# Patient Record
Sex: Female | Born: 1995 | Race: White | Hispanic: No | Marital: Married | Smoking: Current some day smoker
Health system: Southern US, Community
[De-identification: ages and names within clinical notes are randomized; demographics above are authoritative.]

## PROBLEM LIST (undated history)

## (undated) HISTORY — PX: TONSILLECTOMY: SUR1361

## (undated) HISTORY — PX: FRACTURE SURGERY: SHX138

---

## 2020-06-01 ENCOUNTER — Encounter: Payer: Self-pay | Admitting: Emergency Medicine

## 2020-06-01 ENCOUNTER — Emergency Department (INDEPENDENT_AMBULATORY_CARE_PROVIDER_SITE_OTHER): Payer: PRIVATE HEALTH INSURANCE

## 2020-06-01 ENCOUNTER — Emergency Department (INDEPENDENT_AMBULATORY_CARE_PROVIDER_SITE_OTHER)
Admission: EM | Admit: 2020-06-01 | Discharge: 2020-06-01 | Disposition: A | Discharge status: Home / Self Care | Payer: PRIVATE HEALTH INSURANCE | Source: Home / Self Care

## 2020-06-01 ENCOUNTER — Other Ambulatory Visit: Payer: Self-pay

## 2020-06-01 DIAGNOSIS — S59912A Unspecified injury of left forearm, initial encounter: Secondary | ICD-10-CM

## 2020-06-01 DIAGNOSIS — M79602 Pain in left arm: Secondary | ICD-10-CM | POA: Diagnosis not present

## 2020-06-01 MED ORDER — NAPROXEN 500 MG PO TABS: 500.0000 mg | ORAL_TABLET | Freq: Two times a day (BID) | ORAL | 0 refills | Status: DC | PRN
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
## 2020-06-01 NOTE — Discharge Instructions (Addendum)
Recommend ace wrapping, along with icing, and taking antiinflammatory twice daily over the next 3-5 days. If no improvement in pain, recommend orthopedic follow-up.

## 2020-06-07 ENCOUNTER — Ambulatory Visit: Admitting: Adult Health

## 2022-03-16 IMAGING — DX DG FOREARM 2V*L*
2 series · 2 of 2 positions shown · non-contrast
Comparison: No prior.

CLINICAL DATA: Left forearm injury.

EXAM:
LEFT FOREARM - 2 VIEW

[forearm ap]
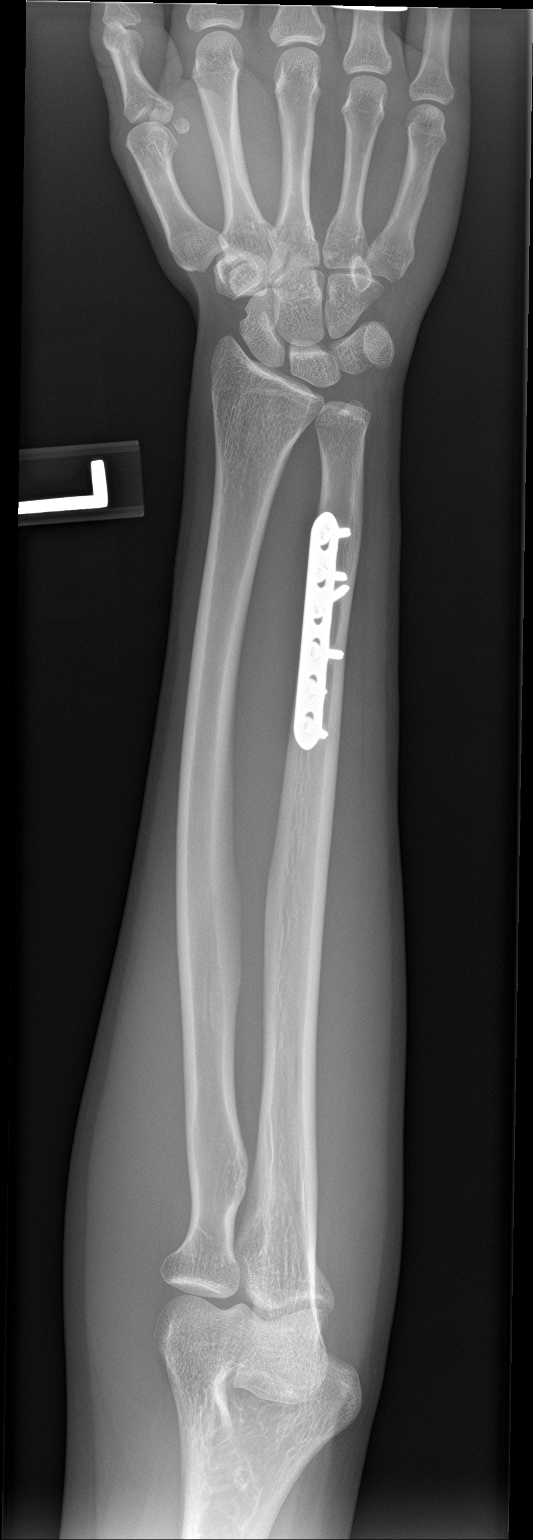

[forearm lat]
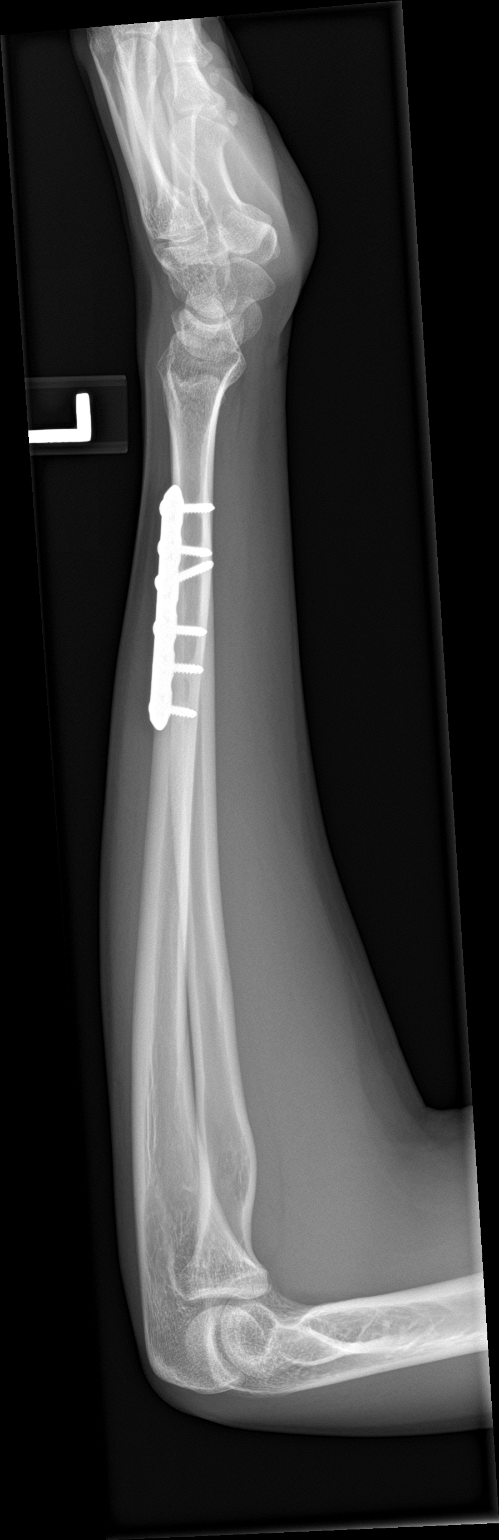

[2 of 2 positions shown; findings below may reference images not displayed]

FINDINGS: Plate screw fixation of the ulna noted. Hardware intact. Anatomic
alignment. Radius is intact. No acute bony abnormality. No evidence
of acute fracture or dislocation.
IMPRESSION: Plate and screw fixation of the ulna. Hardware intact. Anatomic
alignment. No acute bony abnormality.

## 2023-12-15 ENCOUNTER — Ambulatory Visit
Admission: EM | Admit: 2023-12-15 | Discharge: 2023-12-15 | Disposition: A | Discharge status: Home / Self Care | Attending: Physician Assistant | Admitting: Physician Assistant

## 2023-12-15 DIAGNOSIS — H6992 Unspecified Eustachian tube disorder, left ear: Secondary | ICD-10-CM | POA: Diagnosis not present

## 2023-12-15 DIAGNOSIS — H9202 Otalgia, left ear: Secondary | ICD-10-CM | POA: Diagnosis not present

## 2023-12-15 MED ORDER — IBUPROFEN 400 MG PO TABS: 400.0000 mg | ORAL_TABLET | Freq: Four times a day (QID) | ORAL | 0 refills | Status: AC | PRN

## 2023-12-15 MED ORDER — FLUTICASONE PROPIONATE 50 MCG/ACT NA SUSP: 1.0000 | Freq: Every day | NASAL | 0 refills | Status: AC

## 2023-12-15 NOTE — ED Provider Notes (Signed)
 [facility]    CSN: [id number] Arrival date & time: 12/15/23  1357      History   Chief Complaint Chief Complaint  Patient presents with   Otalgia    HPI Valerie Torres is a 28 y.o. female.   Patient presents today with a 2 to 3-day history of left-sided ear pain.  She reports radiation towards her jaw and into her neck.  She denies any additional symptoms including fever, cough, congestion, sore throat, dental pain.  She has tried Tylenol without improvement of symptoms.  She reports that pain is rated 7 on a 0-10 pain scale, described as intense aching, no aggravating relieving factors identified.  She is currently breast-feeding but is confident that she is not pregnant.  She does occasionally use Q-tips and earbuds but not regularly.  Denies any associated otorrhea.  She denies any recent airplane travel or swimming.    History reviewed. No pertinent past medical history.  There are no active problems to display for this patient.   Past Surgical History:  Procedure Laterality Date   FRACTURE SURGERY Left    ulnar  2017 - plate & screws   TONSILLECTOMY      OB History   No obstetric history on file.      Home Medications    Prior to Admission medications   Medication Sig Start Date End Date Taking? Authorizing Provider  fluticasone (FLONASE) 50 MCG/ACT nasal spray Place 1 spray into both nostrils daily. 12/15/23  Yes Marvelous Woolford K, PA-C  ibuprofen (ADVIL) 400 MG tablet Take 1 tablet (400 mg total) by mouth every 6 (six) hours as needed. 12/15/23  Yes Johntavius Shepard, Betsey Brow, PA-C    Family History Family History  Problem Relation Age of Onset   Hypertension Mother    Hypertension Father    Healthy Sister     Social History Social History   Tobacco Use   Smoking status: Some Days    Types: E-cigarettes   Smokeless tobacco: Never  Vaping Use   Vaping status: Some Days   Substances: Nicotine, Flavoring, Nicotine-salt  Substance Use Topics   Alcohol use: Yes     Alcohol/week: 1.0 standard drink of alcohol    Types: 1 Standard drinks or equivalent per week   Drug use: Never     Allergies   Patient has no known allergies.   Review of Systems Review of Systems  Constitutional:  Positive for activity change. Negative for appetite change, fatigue and fever.  HENT:  Positive for ear pain. Negative for congestion, ear discharge, hearing loss, sinus pressure, sneezing and sore throat.   Respiratory:  Negative for cough and shortness of breath.   Cardiovascular:  Negative for chest pain.  Gastrointestinal:  Negative for abdominal pain, diarrhea, nausea and vomiting.  Neurological:  Negative for dizziness, light-headedness and headaches.     Physical Exam Triage Vital Signs ED Triage Vitals  Encounter Vitals Group     BP 12/15/23 1510 124/83     Systolic BP Percentile --      Diastolic BP Percentile --      Pulse Rate 12/15/23 1510 93     Resp 12/15/23 1510 18     Temp 12/15/23 1510 98.2 F (36.8 C)     Temp src --      SpO2 12/15/23 1510 98 %     Weight --      Height --      Head Circumference --  Peak Flow --      Pain Score 12/15/23 1507 7     Pain Loc --      Pain Education --      Exclude from Growth Chart --    No data found.  Updated Vital Signs BP 124/83   Pulse 93   Temp 98.2 F (36.8 C)   Resp 18   LMP 12/08/2023   SpO2 98%   Breastfeeding Yes   Visual Acuity Right Eye Distance:   Left Eye Distance:   Bilateral Distance:    Right Eye Near:   Left Eye Near:    Bilateral Near:     Physical Exam Vitals reviewed.  Constitutional:      General: She is awake. She is not in acute distress.    Appearance: Normal appearance. She is well-developed. She is not ill-appearing.     Comments: Very pleasant female appears stated age in no acute distress sitting comfortably in exam room holding her infant on her lap  HENT:     Head: Normocephalic and atraumatic.     Right Ear: Tympanic membrane, ear canal and  external ear normal. Tympanic membrane is not erythematous or bulging.     Left Ear: Ear canal and external ear normal. No swelling or tenderness. A middle ear effusion is present. Tympanic membrane is not erythematous or bulging.     Ears:     Comments: Left ear: No evidence of infection.  Moderate effusion with air-fluid level.  No pain with palpation of tragus or manipulation of external ear.  Normal-appearing external auditory canal.    Nose: Nose normal.     Mouth/Throat:     Dentition: No dental tenderness, gingival swelling or dental abscesses.     Pharynx: Uvula midline. Postnasal drip present. No oropharyngeal exudate or posterior oropharyngeal erythema.     Comments: No evidence of dental abscess or infection.  No evidence of Ludwig angina.  Mild postnasal drainage and posterior oropharynx. Neck:     Comments: Neck: No pain percussion of vertebrae.  No tenderness palpation of paraspinal muscles.  No tenderness or deformity over SCM. Cardiovascular:     Rate and Rhythm: Normal rate and regular rhythm.     Heart sounds: Normal heart sounds, S1 normal and S2 normal. No murmur heard. Pulmonary:     Effort: Pulmonary effort is normal.     Breath sounds: Normal breath sounds. No wheezing, rhonchi or rales.     Comments: Clear to auscultation bilaterally Musculoskeletal:     Cervical back: Normal range of motion and neck supple. No pain with movement, spinous process tenderness or muscular tenderness. Normal range of motion.  Lymphadenopathy:     Head:     Right side of head: No submental, submandibular or tonsillar adenopathy.     Left side of head: No submental, submandibular or tonsillar adenopathy.  Psychiatric:        Behavior: Behavior is cooperative.      UC Treatments / Results  Labs (all labs ordered are listed, but only abnormal results are displayed) Labs Reviewed - No data to display  EKG   Radiology No results found.  Procedures Procedures (including critical  care time)  Medications Ordered in UC Medications - No data to display  Initial Impression / Assessment and Plan / UC Course  I have reviewed the triage vital signs and the nursing notes.  Pertinent labs & imaging results that were available during my care of the patient were reviewed by  me and considered in my medical decision making (see chart for details).     Patient is well-appearing, afebrile, nontoxic, nontachycardic.  No evidence of acute infection on physical exam that warrant initiation of antibiotics.  Low suspicion for strep pharyngitis and so testing was deferred based on Centor score of 1 based on not having a cough only.  Suspect symptoms are related to eustachian tube dysfunction.  She was started on fluticasone nasal spray but will defer systemic steroids due to concern that this would pass through breastmilk to infant.  We will defer oral antihistamines due to concern that this would reduce milk production.  She was started on ibuprofen 400 mg to be taken every 6 hours for pain relief but we discussed that she is not to take additional NSAIDs with this medicine.  Discussed that if her symptoms are not improving quickly she should follow-up with ENT for further evaluation and management given her limited resources.  She was given the contact information for local provider with instruction to call to schedule an appointment as needed.  Strict return precautions given.    Final Clinical Impressions(s) / UC Diagnoses   Final diagnoses:  Eustachian tube dysfunction, left  Acute otalgia, left     Discharge Instructions      I do not see any evidence of an infection.  I believe that you have eustachian tube dysfunction that is causing a fluid buildup behind your ear.  Start fluticasone nasal spray.  Take ibuprofen 400 mg every 6 hours as needed.  I also recommend nasal saline and sinus rinses.  If your symptoms are not improving please follow-up with ENT; call to schedule  appointment.  If anything worsens and you have drainage from the ear, increasing pain, fever, nausea/vomiting you need to be seen immediately.    ED Prescriptions     Medication Sig Dispense Auth. Provider   fluticasone (FLONASE) 50 MCG/ACT nasal spray Place 1 spray into both nostrils daily. 16 g Efraim Vanallen K, PA-C   ibuprofen (ADVIL) 400 MG tablet Take 1 tablet (400 mg total) by mouth every 6 (six) hours as needed. 30 tablet Drayton Tieu K, PA-C      PDMP not reviewed this encounter.   Budd Cargo, PA-C 12/15/23 1634

## 2023-12-15 NOTE — ED Triage Notes (Signed)
 Patient to Urgent Care with complaints of left sided ear pain. Denies any known fevers.   Symptoms started 2-3 days ago.   Meds: taking tylenol.

## 2023-12-15 NOTE — Discharge Instructions (Signed)
 I do not see any evidence of an infection.  I believe that you have eustachian tube dysfunction that is causing a fluid buildup behind your ear.  Start fluticasone nasal spray.  Take ibuprofen 400 mg every 6 hours as needed.  I also recommend nasal saline and sinus rinses.  If your symptoms are not improving please follow-up with ENT; call to schedule appointment.  If anything worsens and you have drainage from the ear, increasing pain, fever, nausea/vomiting you need to be seen immediately.

## 2023-12-16 ENCOUNTER — Ambulatory Visit
Admission: RE | Admit: 2023-12-16 | Discharge: 2023-12-16 | Disposition: A | Discharge status: Home / Self Care | Source: Ambulatory Visit | Attending: Physician Assistant | Admitting: Physician Assistant

## 2023-12-16 VITALS — BP 117/80 | HR 84 | Temp 98.2°F | Resp 18

## 2023-12-16 DIAGNOSIS — H9202 Otalgia, left ear: Secondary | ICD-10-CM

## 2023-12-16 MED ORDER — AMOXICILLIN-POT CLAVULANATE 875-125 MG PO TABS: 1.0000 | ORAL_TABLET | Freq: Two times a day (BID) | ORAL | 0 refills | Status: AC

## 2023-12-16 NOTE — ED Triage Notes (Signed)
 Patient to Urgent Care with complaints of worsening left sided ear pain.  Symptoms started 3-4 days ago. Seen yesterday- reports no relief in symptoms.

## 2023-12-16 NOTE — Discharge Instructions (Addendum)
 As we discussed, I do not see any evidence of an infection.  We will go ahead and start an antibiotic that would cover for ear, sinus, throat infections.  Start Augmentin twice daily for 7 days.  This is passed through breastmilk to monitor baby for any stomach problems, diarrhea, nausea/vomiting, rash, irritability.  Continue the other medication that was prescribed at yesterday's visit.  As we discussed, if this is not improving with the medication I do recommend seeing a specialist given the severity of pain since your exam is normal.  You may need imaging that I do not have access to in urgent care.  If anything worsens please go to the ER including high fever, difficulty swallowing, swelling of your throat, shortness of breath, weakness.

## 2023-12-16 NOTE — ED Provider Notes (Signed)
 Arlander Bellman    CSN: [id number] Arrival date & time: 12/16/23  1241      History   Chief Complaint Chief Complaint  Patient presents with   Ear Fullness    Came in yesterday for ear pain. Was given nasal spray want to be seen again in pain. - Entered by patient    HPI Dallis Carsten is a 28 y.o. female.   Patient presents today for reevaluation of severe left ear pain.  She was seen by clinic yesterday at which point she was started on fluticasone nasal spray and ibuprofen 400 mg to treat suspected eustachian tube dysfunction.  Her exam was normal and so antibiotics were deferred.  She is breast-feeding and so systemic steroids were deferred as well.  Despite taking medication she continues to have severe left otalgia.  She reports that this is rated 8 on a 0-10 pain scale, described as intense aching, no aggravating or alleviating factors identified.  She denies any otorrhea.  Denies any recent swimming or airplane travel.  Denies any recent antibiotics.  She is concerned because the pain is unrelenting and she is worried that there is some kind of infection.  She denies any cough or congestion symptoms.  Denies any fever, nausea, vomiting.  She is breast-feeding but confident that she is not pregnant.    History reviewed. No pertinent past medical history.  There are no active problems to display for this patient.   Past Surgical History:  Procedure Laterality Date   FRACTURE SURGERY Left    ulnar  2017 - plate & screws   TONSILLECTOMY      OB History   No obstetric history on file.      Home Medications    Prior to Admission medications   Medication Sig Start Date End Date Taking? Authorizing Provider  amoxicillin-clavulanate (AUGMENTIN) 875-125 MG tablet Take 1 tablet by mouth every 12 (twelve) hours. 12/16/23  Yes Justice Milliron K, PA-C  fluticasone (FLONASE) 50 MCG/ACT nasal spray Place 1 spray into both nostrils daily. 12/15/23   Feliz Herard K, PA-C  ibuprofen  (ADVIL) 400 MG tablet Take 1 tablet (400 mg total) by mouth every 6 (six) hours as needed. 12/15/23   Savior Himebaugh, Betsey Brow, PA-C    Family History Family History  Problem Relation Age of Onset   Hypertension Mother    Hypertension Father    Healthy Sister     Social History Social History   Tobacco Use   Smoking status: Some Days    Types: E-cigarettes   Smokeless tobacco: Never  Vaping Use   Vaping status: Some Days   Substances: Nicotine, Flavoring, Nicotine-salt  Substance Use Topics   Alcohol use: Yes    Alcohol/week: 1.0 standard drink of alcohol    Types: 1 Standard drinks or equivalent per week   Drug use: Never     Allergies   Patient has no known allergies.   Review of Systems Review of Systems  Constitutional:  Positive for activity change. Negative for appetite change, fatigue and fever.  HENT:  Positive for ear pain. Negative for congestion, ear discharge, sinus pressure, sneezing and sore throat.   Respiratory:  Negative for cough and shortness of breath.   Cardiovascular:  Negative for chest pain.  Gastrointestinal:  Negative for abdominal pain, diarrhea, nausea and vomiting.  Neurological:  Negative for dizziness, light-headedness and headaches.     Physical Exam Triage Vital Signs ED Triage Vitals [12/16/23 1329]  Encounter Vitals Group  BP 117/80     Systolic BP Percentile      Diastolic BP Percentile      Pulse Rate 84     Resp 18     Temp 98.2 F (36.8 C)     Temp src      SpO2 98 %     Weight      Height      Head Circumference      Peak Flow      Pain Score      Pain Loc      Pain Education      Exclude from Growth Chart    No data found.  Updated Vital Signs BP 117/80   Pulse 84   Temp 98.2 F (36.8 C)   Resp 18   LMP 12/08/2023   SpO2 98%   Visual Acuity Right Eye Distance:   Left Eye Distance:   Bilateral Distance:    Right Eye Near:   Left Eye Near:    Bilateral Near:     Physical Exam Vitals reviewed.   Constitutional:      General: She is awake. She is not in acute distress.    Appearance: Normal appearance. She is well-developed. She is not ill-appearing.     Comments: Very pleasant female appears stated age in no acute distress sitting comfortably in exam room  HENT:     Head: Normocephalic and atraumatic.     Right Ear: Tympanic membrane, ear canal and external ear normal. Tympanic membrane is not erythematous or bulging.     Left Ear: Ear canal and external ear normal. A middle ear effusion is present. Tympanic membrane is not erythematous or bulging.     Ears:     Comments: Left ear: No pain with palpation of tragus or pain with manipulation of external ear.  Normal-appearing external auditory canal.  No cerumen.  Mild effusion with air-fluid level but no evidence of erythema or bulging.    Nose:     Right Sinus: No maxillary sinus tenderness or frontal sinus tenderness.     Left Sinus: No maxillary sinus tenderness or frontal sinus tenderness.     Mouth/Throat:     Pharynx: Uvula midline. Postnasal drip present. No oropharyngeal exudate or posterior oropharyngeal erythema.  Neck:     Comments: No tenderness along SCM or cervical paraspinal muscles.  No pain percussion of vertebrae.  No cervical lymphadenopathy on exam Cardiovascular:     Rate and Rhythm: Normal rate and regular rhythm.     Heart sounds: Normal heart sounds, S1 normal and S2 normal. No murmur heard. Pulmonary:     Effort: Pulmonary effort is normal.     Breath sounds: Normal breath sounds. No wheezing, rhonchi or rales.     Comments: Clear to auscultation bilaterally Musculoskeletal:     Cervical back: Normal range of motion and neck supple. No spinous process tenderness or muscular tenderness.  Psychiatric:        Behavior: Behavior is cooperative.      UC Treatments / Results  Labs (all labs ordered are listed, but only abnormal results are displayed) Labs Reviewed - No data to  display  EKG   Radiology No results found.  Procedures Procedures (including critical care time)  Medications Ordered in UC Medications - No data to display  Initial Impression / Assessment and Plan / UC Course  I have reviewed the triage vital signs and the nursing notes.  Pertinent labs & imaging results that  were available during my care of the patient were reviewed by me and considered in my medical decision making (see chart for details).     Patient is well-appearing, afebrile, nontoxic, nontachycardic.  Unclear etiology of symptoms.  We discussed that there is no evidence of otitis media on exam and I cannot explain her pain.  She is very concerned that there is some kind of infection given how quickly the pain started and that has progressed.  I did agree to give a course of Augmentin as this will cover for most head and neck infections in case there is an infectious component to her symptoms.  We discussed that this can be passed through breastmilk but is generally considered safe for breast-feeding.  We discussed that he should monitor baby for any kind of adverse reactions including rash or GI upset.  She is to continue medications as previously prescribed including ibuprofen or fluticasone nasal spray.  We discussed that if her symptoms are not improving with this medication regimen she would likely benefit from seeing a specialist as she may need imaging or other interventions that we do not have access to in urgent care.  She was given the contact information for local ENT provider with instruction to call to schedule an appointment if she does not have significant improvement within a few days.  We discussed that if anything worsens or changes she should be seen immediately.  Strict return precautions given.  All questions were answered to her satisfaction.  Final Clinical Impressions(s) / UC Diagnoses   Final diagnoses:  Acute otalgia, left     Discharge Instructions       As we discussed, I do not see any evidence of an infection.  We will go ahead and start an antibiotic that would cover for ear, sinus, throat infections.  Start Augmentin twice daily for 7 days.  This is passed through breastmilk to monitor baby for any stomach problems, diarrhea, nausea/vomiting, rash, irritability.  Continue the other medication that was prescribed at yesterday's visit.  As we discussed, if this is not improving with the medication I do recommend seeing a specialist given the severity of pain since your exam is normal.  You may need imaging that I do not have access to in urgent care.  If anything worsens please go to the ER including high fever, difficulty swallowing, swelling of your throat, shortness of breath, weakness.     ED Prescriptions     Medication Sig Dispense Auth. Provider   amoxicillin-clavulanate (AUGMENTIN) 875-125 MG tablet Take 1 tablet by mouth every 12 (twelve) hours. 14 tablet Lyzette Reinhardt K, PA-C      PDMP not reviewed this encounter.   Budd Cargo, PA-C 12/16/23 1355

## 2024-04-25 ENCOUNTER — Emergency Department
Admission: EM | Admit: 2024-04-25 | Discharge: 2024-04-25 | Disposition: A | Discharge status: Home / Self Care | Attending: Emergency Medicine | Admitting: Emergency Medicine

## 2024-04-25 ENCOUNTER — Other Ambulatory Visit: Payer: Self-pay

## 2024-04-25 DIAGNOSIS — H6992 Unspecified Eustachian tube disorder, left ear: Secondary | ICD-10-CM | POA: Diagnosis not present

## 2024-04-25 DIAGNOSIS — H9203 Otalgia, bilateral: Secondary | ICD-10-CM | POA: Insufficient documentation

## 2024-04-25 MED ORDER — PREDNISONE 20 MG PO TABS
60.0000 mg | ORAL_TABLET | Freq: Once | ORAL | Status: AC
  Administered 2024-04-25: 60 mg via ORAL
  Filled 2024-04-25: qty 3

## 2024-04-25 MED ORDER — METHYLPREDNISOLONE 4 MG PO TBPK: ORAL_TABLET | ORAL | 0 refills | Status: AC

## 2024-04-25 NOTE — Discharge Instructions (Addendum)
 You were seen in the emergency department for eustachian tube dysfunction and bilateral ear pain.  Please pick up the medication and take as prescribed.  Please follow-up with the ear nose throat doctor (Dr. Jessee) as soon as possible.  Return to the emergency department for any fever, chills, nausea, vomiting, bloody discharge, or any other new, worsening or concerning symptoms.

## 2024-04-25 NOTE — ED Notes (Signed)
 PT in no acute distress prior to discharge. Discharged instructions reviewed and pt stated that they understand directions. Pt has all belongings with them at time of discharge.

## 2024-04-25 NOTE — ED Triage Notes (Signed)
 Patient brought in by self for bil ear pain worse on L ear with L ear clear discharge, hx ear tubes. Pt seen twice at urgent care, prescribed abx, steroids, allergy meds, nasal spray. All completed as prescribed. AxOx4, ambulatory with steady gait.

## 2024-04-25 NOTE — ED Provider Notes (Signed)
 [facility] Provider Note    Event Date/Time   First MD Initiated Contact with Patient 04/25/24 2034     (approximate)   History   Ear Pain   HPI  Valerie Torres is a 28 y.o. female  with no significant past medical history presents to the emergency department with bilateral ear pain x 1 month and clear otorrhea from the left ear.  She reports she has been seen at multiple urgent cares and at the [facility] for her symptoms, tried ibuprofen , Tylenol, nasal spray, antibiotics and oral steroids, allergy medications recently, all taken as prescribed.  Patient states the only medication that seems to help her symptoms is an oral steroid.  Has had this concern since May and symptoms have been on and off since then.  States she had went swimming a couple of weeks ago.  No recent airplane travel.  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  Has a follow-up with ENT scheduled in 3 weeks, but was looking to get an earlier referral if possible.  Denies fever, chills, nausea, vomiting, cough, rhinorrhea, sore throat, dental pain.  Has not placed anything in her ear. Patient is currently breast-feeding.    Physical Exam   Triage Vital Signs: ED Triage Vitals  Encounter Vitals Group     BP 04/25/24 1911 (!) 140/92     Girls Systolic BP Percentile --      Girls Diastolic BP Percentile --      Boys Systolic BP Percentile --      Boys Diastolic BP Percentile --      Pulse Rate 04/25/24 1911 88     Resp 04/25/24 1911 16     Temp 04/25/24 1911 98.4 F (36.9 C)     Temp Source 04/25/24 1911 Oral     SpO2 04/25/24 1911 98 %     Weight 04/25/24 1912 131 lb (59.4 kg)     Height 04/25/24 1912 5' 2 (1.575 m)     Head Circumference --      Peak Flow --      Pain Score 04/25/24 1912 5     Pain Loc --      Pain Education --      Exclude from Growth Chart --     Most recent vital signs: Vitals:   04/25/24 1911  BP: (!) 140/92  Pulse: 88  Resp: 16  Temp:  98.4 F (36.9 C)  SpO2: 98%    General: Awake, in no acute distress. Appears stated age. Head: Normocephalic, atraumatic. Eyes: PERRLA. EOMs intact. No scleral icterus or conjunctival injection. Ears/Nose/Throat: TMs intact b/l, no bulging or erythema. Left ear with moderate effusion and air-fluid level. No TTP of tragus or outer debris on b/l EAC. Nares patent, no nasal discharge. Oropharynx moist, no erythema or exudate. Dentition intact. Neck: Supple, no lymphadenopathy, no nuchal rigidity. CV: Good peripheral perfusion. No edema.  Respiratory:Normal respiratory effort.  No respiratory distress.  GI: Soft, non-distended. MSK: No midline cervical or SCM tenderness.   ED Results / Procedures / Treatments   Labs (all labs ordered are listed, but only abnormal results are displayed) Labs Reviewed - No data to display   EKG     RADIOLOGY    PROCEDURES:  Critical Care performed: No   Procedures   MEDICATIONS ORDERED IN ED: Medications  predniSONE  (DELTASONE ) tablet 60 mg (60 mg Oral Given 04/25/24 2146)     IMPRESSION / MDM /  ASSESSMENT AND PLAN / ED COURSE  I reviewed the triage vital signs and the nursing notes.                              Differential diagnosis includes, but is not limited to, eustachian tube dysfunction, otitis media, otitis externa, cholesteatoma  Patient's presentation is most consistent with acute, uncomplicated illness.  Patient is a 28 year old female who is well-appearing, no tachycardia or fever.  All vital signs within normal range prior to discharge.  I am unsure of the etiology of her symptoms; this seems to be something that has been waxing and waning since May 2025.  She has yet to see a ENT specialist due to concerns about insurance coverage.  She does not have any signs on physical exam concerning for an infection. Seems this is most consistent with a eustachian tube dysfunction with otorrhea present in the left ear.  She states  the only medication that has seemed to help her is an oral steroid taper pack.  Review of her chart shows she was prescribed methylprednisolone  on 8/14 at the [facility] Emergency department and treated with Augmentin , Chlorpheniramine maleate, and methylprednisolone  for eustachian tube dysfunction.  I did provide her with 1 dose of prednisone  here and will send another methylprednisolone  taper pack.  She is instructed to use Tylenol at home for pain.  Did provide referral to ENT with hopes of getting her an earlier appointment than originally scheduled for 3 weeks out with another ENT office.  The patient may return to the emergency department for any new, worsening, or concerning symptoms. Patient was given the opportunity to ask questions; all questions were answered. Emergency department return precautions were discussed with the patient.  Patient is in agreement to the treatment plan.  Patient is stable for discharge.   FINAL CLINICAL IMPRESSION(S) / ED DIAGNOSES   Final diagnoses:  Eustachian tube dysfunction, left  Otalgia of both ears     Rx / DC Orders   ED Discharge Orders          Ordered    methylPREDNISolone  (MEDROL  DOSEPAK) 4 MG TBPK tablet        04/25/24 2158             Note:  This document was prepared using Dragon voice recognition software and may include unintentional dictation errors.     Sheron Salm, PA-C 04/25/24 2308    Dorothyann Drivers, MD 04/25/24 [phone]
